# Patient Record
Sex: Male | Born: 1950 | Race: White | Hispanic: No | Marital: Married
Health system: Midwestern US, Community
[De-identification: ages and names within clinical notes are randomized; demographics above are authoritative.]

## PROBLEM LIST (undated history)

## (undated) DIAGNOSIS — R059 Cough, unspecified: Secondary | ICD-10-CM

## (undated) DIAGNOSIS — M7541 Impingement syndrome of right shoulder: Secondary | ICD-10-CM

## (undated) DIAGNOSIS — K5730 Diverticulosis of large intestine without perforation or abscess without bleeding: Secondary | ICD-10-CM

## (undated) DIAGNOSIS — I493 Ventricular premature depolarization: Secondary | ICD-10-CM

## (undated) DIAGNOSIS — R52 Pain, unspecified: Secondary | ICD-10-CM

## (undated) DIAGNOSIS — M4802 Spinal stenosis, cervical region: Secondary | ICD-10-CM

## (undated) DIAGNOSIS — S8991XD Unspecified injury of right lower leg, subsequent encounter: Secondary | ICD-10-CM

---

## 2014-08-29 LAB — DERMATOLOGY PATHOLOGY

## 2017-12-22 LAB — DERMATOLOGY PATHOLOGY

## 2018-09-29 LAB — DERMATOLOGY PATHOLOGY

## 2019-05-11 ENCOUNTER — Encounter

## 2019-05-11 ENCOUNTER — Inpatient Hospital Stay: Admit: 2019-05-11 | Payer: MEDICARE

## 2019-05-11 DIAGNOSIS — R52 Pain, unspecified: Secondary | ICD-10-CM

## 2019-12-07 ENCOUNTER — Encounter

## 2019-12-29 ENCOUNTER — Inpatient Hospital Stay: Admit: 2019-12-29 | Payer: MEDICARE

## 2019-12-29 ENCOUNTER — Ambulatory Visit: Payer: MEDICARE

## 2019-12-29 DIAGNOSIS — S8991XD Unspecified injury of right lower leg, subsequent encounter: Secondary | ICD-10-CM

## 2020-05-14 ENCOUNTER — Encounter

## 2020-05-14 ENCOUNTER — Inpatient Hospital Stay: Admit: 2020-05-14 | Payer: MEDICARE

## 2020-05-14 DIAGNOSIS — M4802 Spinal stenosis, cervical region: Secondary | ICD-10-CM

## 2020-06-03 ENCOUNTER — Inpatient Hospital Stay: Admit: 2020-06-03 | Payer: MEDICARE

## 2020-06-03 ENCOUNTER — Encounter

## 2020-06-03 DIAGNOSIS — M7541 Impingement syndrome of right shoulder: Secondary | ICD-10-CM

## 2021-03-12 ENCOUNTER — Inpatient Hospital Stay: Admit: 2021-03-12 | Payer: MEDICARE

## 2021-03-12 ENCOUNTER — Encounter

## 2021-03-12 DIAGNOSIS — R059 Cough, unspecified: Secondary | ICD-10-CM

## 2021-09-25 LAB — PSA, DIAGNOSTIC: PSA: 0.98 ng/mL (ref <4.1)

## 2022-06-05 ENCOUNTER — Encounter: Admit: 2022-06-05 | Discharge: 2022-06-05 | Payer: MEDICARE

## 2022-06-05 DIAGNOSIS — I493 Ventricular premature depolarization: Secondary | ICD-10-CM

## 2022-06-05 NOTE — Progress Notes (Deleted)
Holter monitor applied    Nereyda Bowler, MA

## 2022-06-09 ENCOUNTER — Encounter

## 2023-02-17 IMAGING — MR MRI LUMBAR SPINE W/WO CONTRAST
8 of 12 series · 17 of 48 positions shown · IV contrast (gadavist)
Comparison: No prior lumbar examination

________________________________________________________________________________________________ 
MRI LUMBAR SPINE W/WO CONTRAST, 02/17/2023 [DATE]: 
CLINICAL INDICATION: Intervertebral disc disorder with radiculopathy, low back 
pain, disc surgery July 26, 2022
TECHNIQUE: Sagittal T1, Sagittal T2, Sagittal STIR, Axial T2, Axial T1, Enhanced 
sagittal T1, Enhanced fat suppressed sagittal T1, and Enhanced axial T1 MR 
images of the lumbar spine were performed with and without intravenous 
enhancement.  9.5 mL of Gadavist were injected intravenously. 0.5 mL of Gadavist 
was discarded.  Patient was scanned on a 1.5T magnet.

[Series 101: survey · axial · 10.0mm · 1.25mm/px · z∈[+109,+351]mm · 2 of 10 slices shown]
[im 1/10]
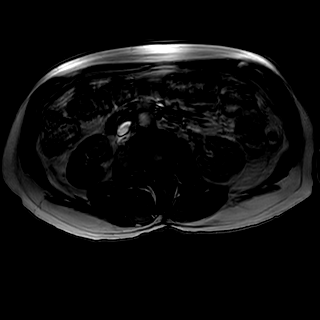
[im 10/10]
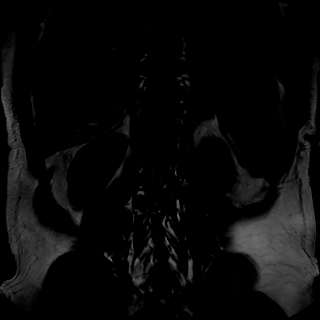

[Series 201: t2w_cor-surv · coronal · 6.0mm · 0.62mm/px · 1 of 14 slices shown]
[im 1/14]
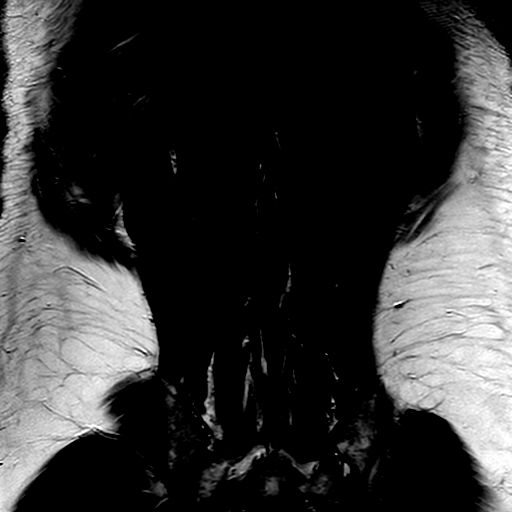

[Series 301: T1 · sagittal · 4.0mm · 0.41mm/px · 2 of 19 slices shown (1 of 2)]
[im 1/19]
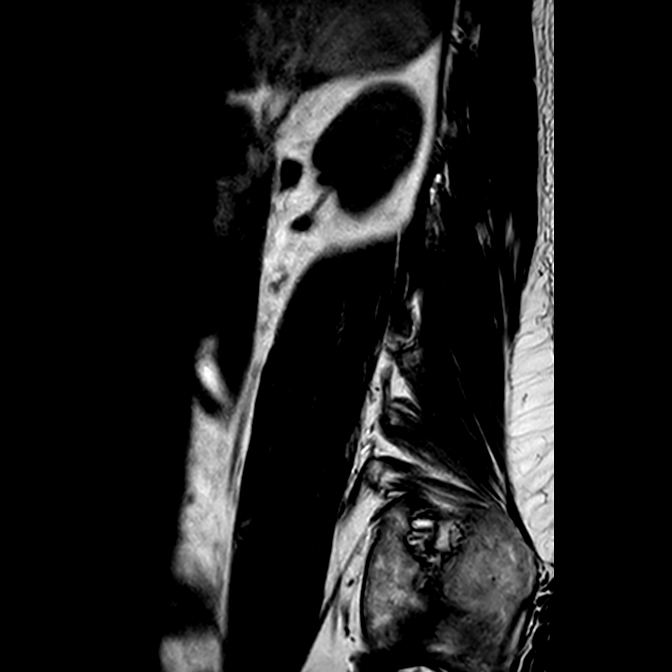
[im 19/19]
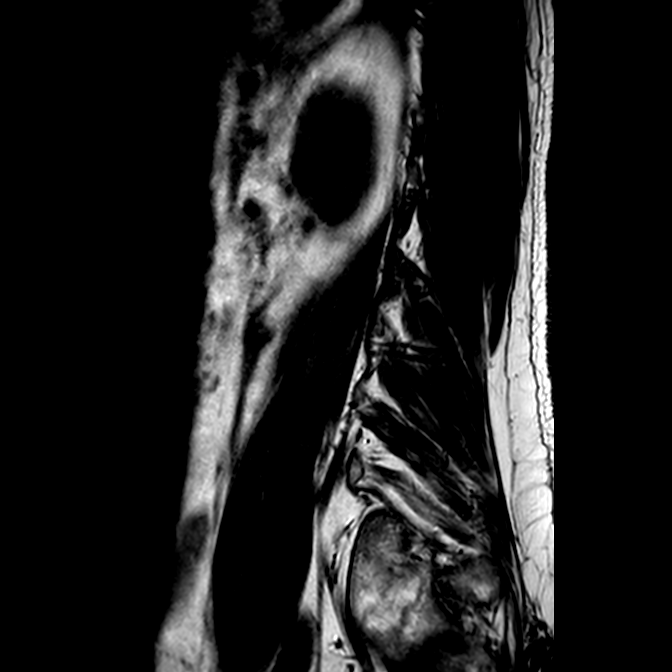

[Series 402: (id)_mdixon_tse · sagittal · 4.0mm · 0.50mm/px · 2 of 19 slices shown]
[im 1/19]
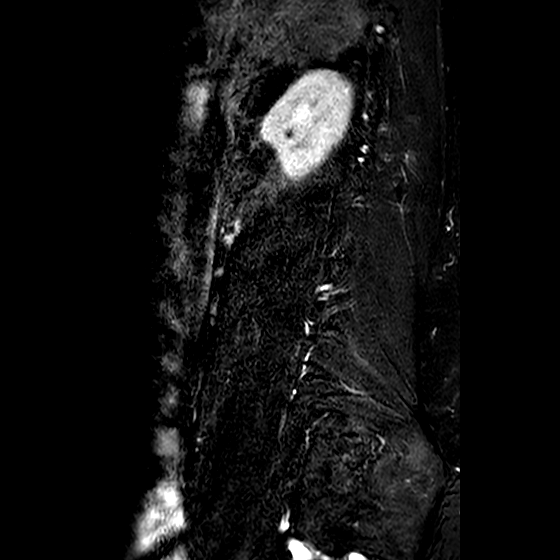
[im 19/19]
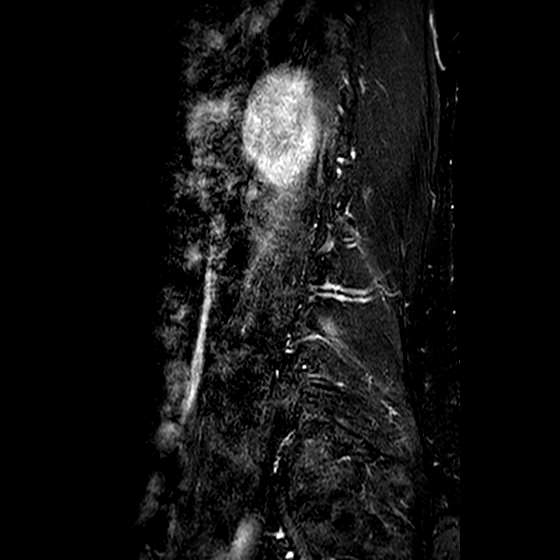

[Series 403: st2w_mdixon_tse · sagittal · 4.0mm · 0.50mm/px · 1 of 19 slices shown]
[im 1/19]
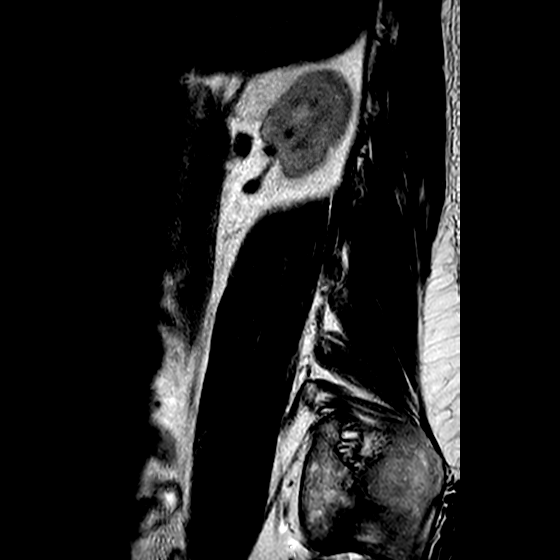

[Series 601: T2 · oblique · 4.0mm · 0.30mm/px · 3 of 30 slices shown]
[im 1/30]
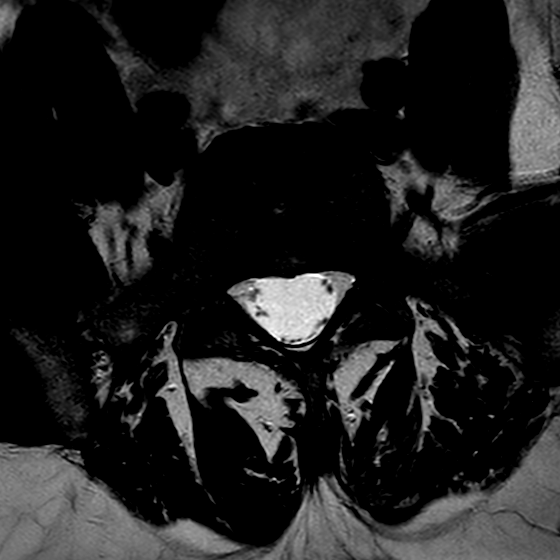
[im 15/30]
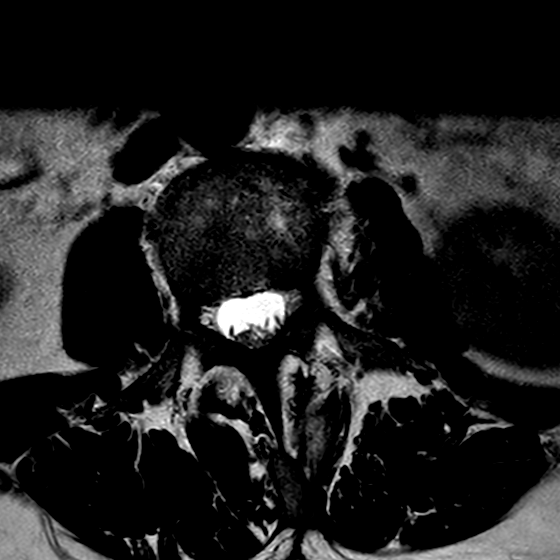
[im 30/30]
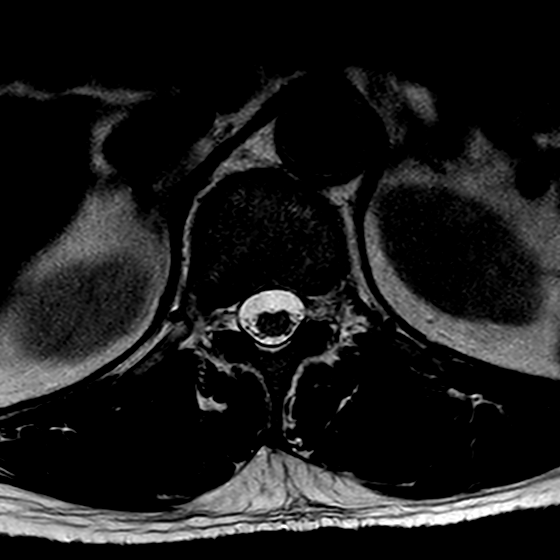

[Series 701: T1 · oblique · 4.0mm · 0.33mm/px · 3 of 30 slices shown (2 of 2)]
[im 1/30]
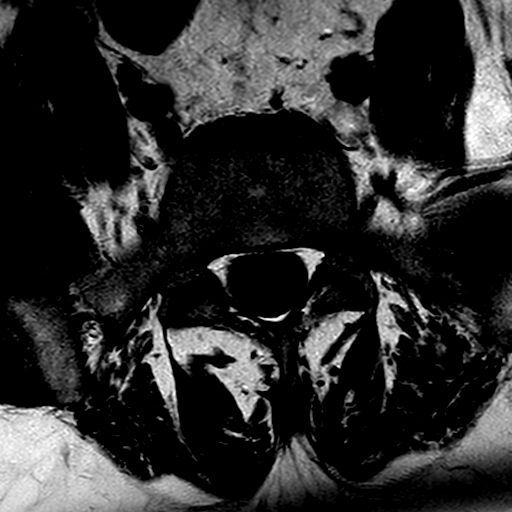
[im 15/30]
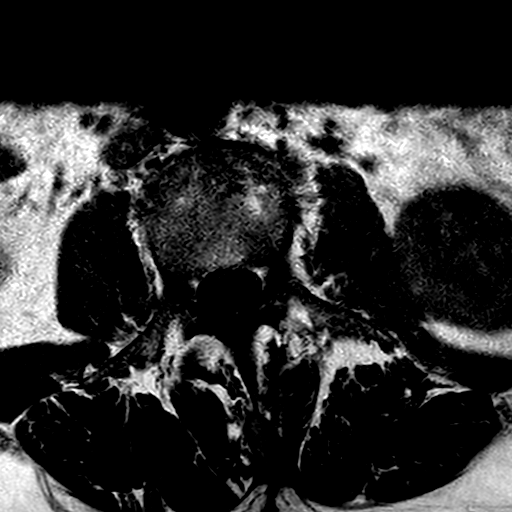
[im 30/30]
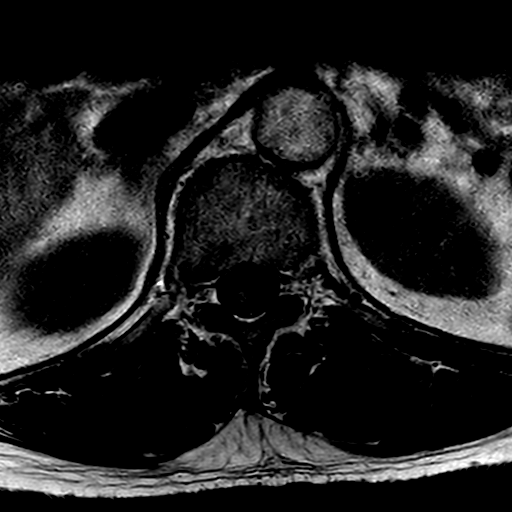

[Series 901: T1 post-contrast · oblique · 4.0mm · 0.33mm/px · 3 of 30 slices shown]
[im 1/30]
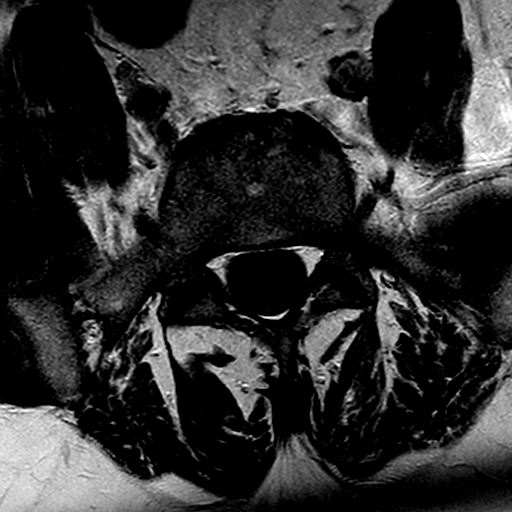
[im 15/30]
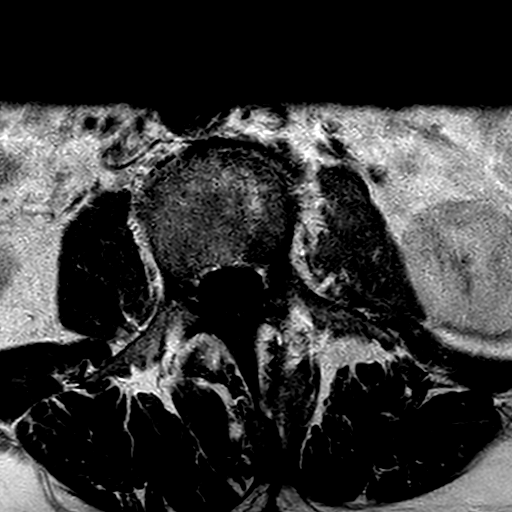
[im 30/30]
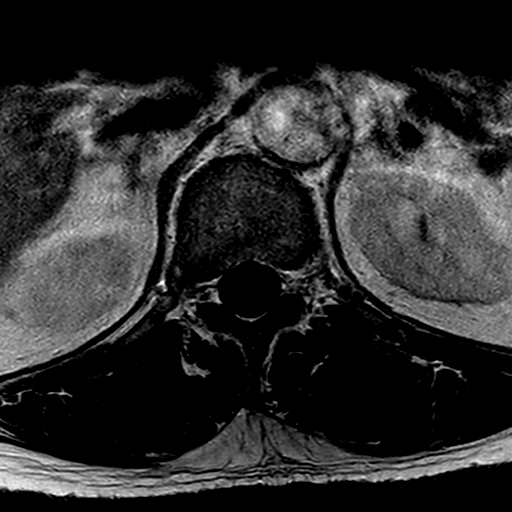

[17 of 48 positions shown; findings below may reference images not displayed]

FINDINGS: At L2-3 there is marked narrowing along the left interspace. There has 
been left laminectomy. There is less than grade 1 retrolisthesis measuring 3 
mm.. There is extensive reactive endplate edema and enhancement. There is 
extension of nonenhancing tissue along the posterosuperior disc space to the 
left of midline, which deforms the left ventral thecal sac, contributing to 
moderate left-sided canal stenosis and impinges the left L3 nerve root, best 
seen on sagittal image 9, axial T2 image 20. On axial images this tissue extends 
13 x 12 mm. The nonenhancing component extends in oblique fashion 2.3 cm 
measured on the fat suppressed enhanced image 9, isointense on T2 weighted 
images, mildly hyperintense on STIR sequences, hypointense on T1-weighted 
images. On sagittal views this soft tissue appears to dissect across the left 
posterior inferior L2 vertebral body. There is no significant enhancement or 
increased T2 signal within the disc space. There is a thin rind of enhancement 
surrounding this soft tissue. There is encroachment at the entrance to the left 
foramen. Right foramen is open. 
There are degenerative changes elsewhere. At L5-S1 the canal and foramina are 
open. 
At L4-5 the canal is open. Mild bilateral foraminal stenosis and moderate facet 
change. 
At L3-4 the canal is open. Foramina are open. Mild to moderate facet change. 
At L1-2 the canal and foramina are 
Conus terminates opposite L1. There is mild upper lumbar dextrocurvature. There 
is no retroperitoneal adenopathy.
IMPRESSION: At L2-3 there has been left laminectomy. There is soft tissue extending from the 
posterior disc space, appearing to dissect across the posterior inferior L2 
vertebral body into the left ventral epidural space opposite L2 where there is 
marked deformity on the thecal sac and impingement of the left L3 nerve root. 
This tissue does not show internal enhancement. There is a surrounding rind of 
enhancement, and there is reactive edema and enhancement of the L2-3 endplates. 
This likely represents a large disc extrusion, but abscess cannot be excluded. 
The L2-3 disc space does not show enhancement.

## 2023-02-17 IMAGING — DX LUMBAR SPINE AP, LAT WITH FLEXION AND EXTEN
1 series · 4 of 4 positions shown · non-contrast
Comparison: None prior.

________________________________________________________________________________________________ 
LUMBAR SPINE AP, LAT WITH FLEXION AND EXTEN, 02/17/2023 [DATE]: 
CLINICAL INDICATION: Intervertebral disc disorders w radiculopathy, lumbar 
region.

[Series 1: AP · 0.14mm/px · 4 of 4 slices shown]
[im 1/4]
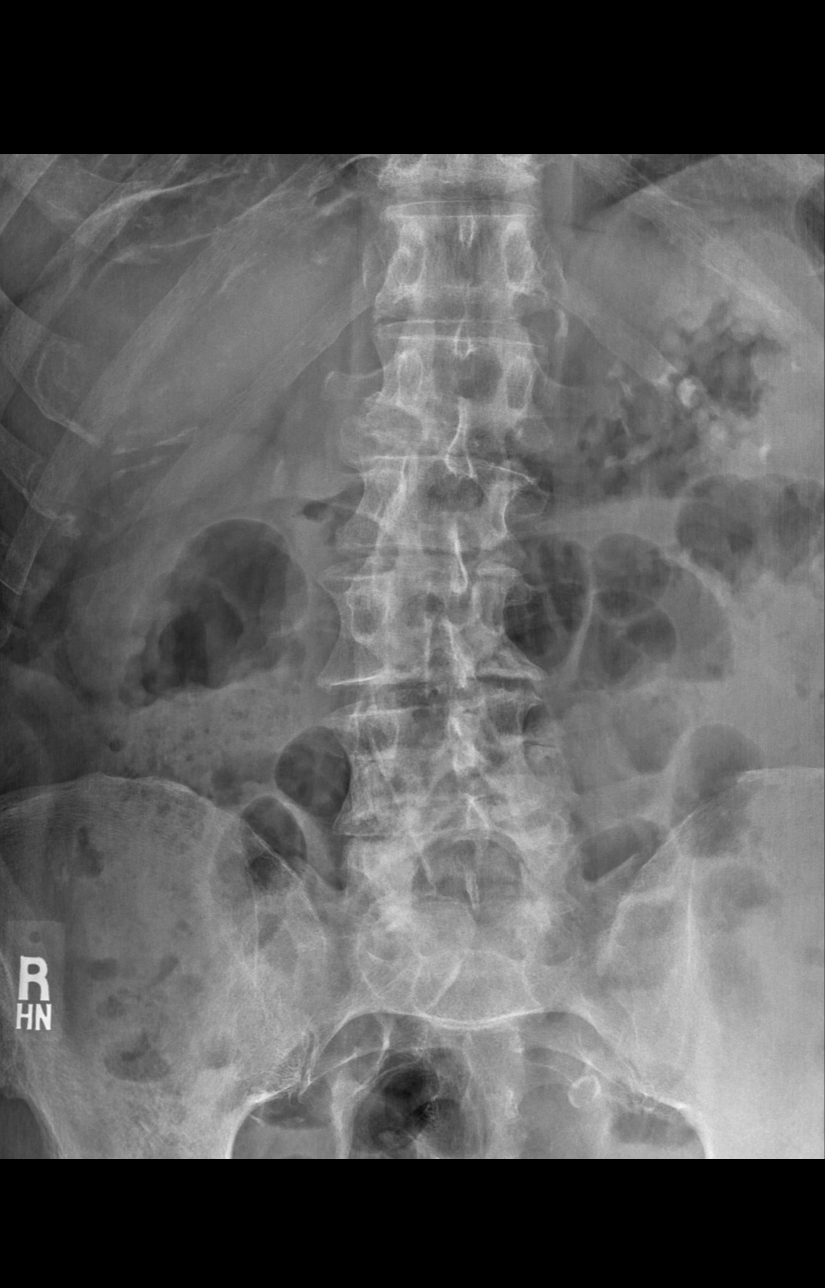
[im 2/4]
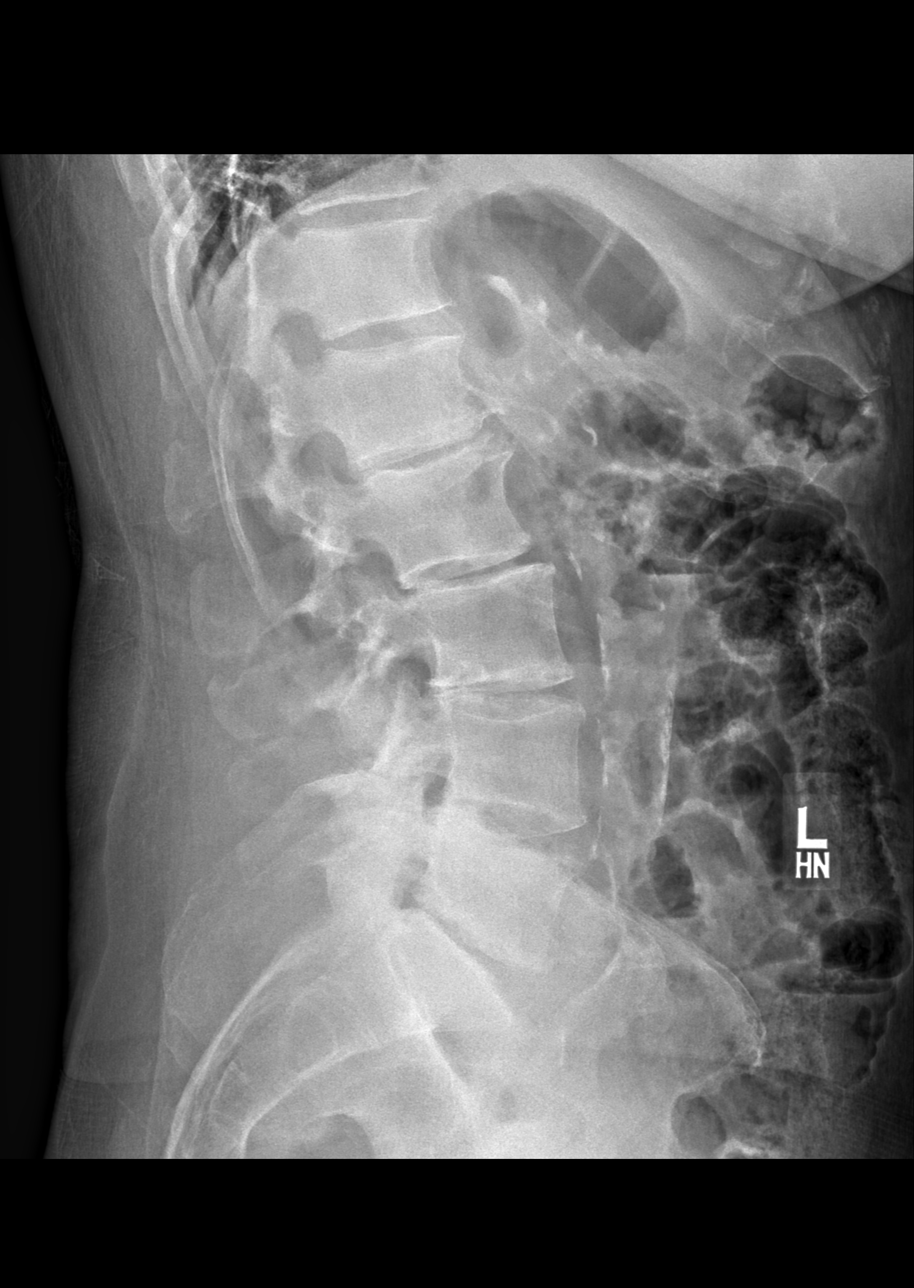
[im 3/4]
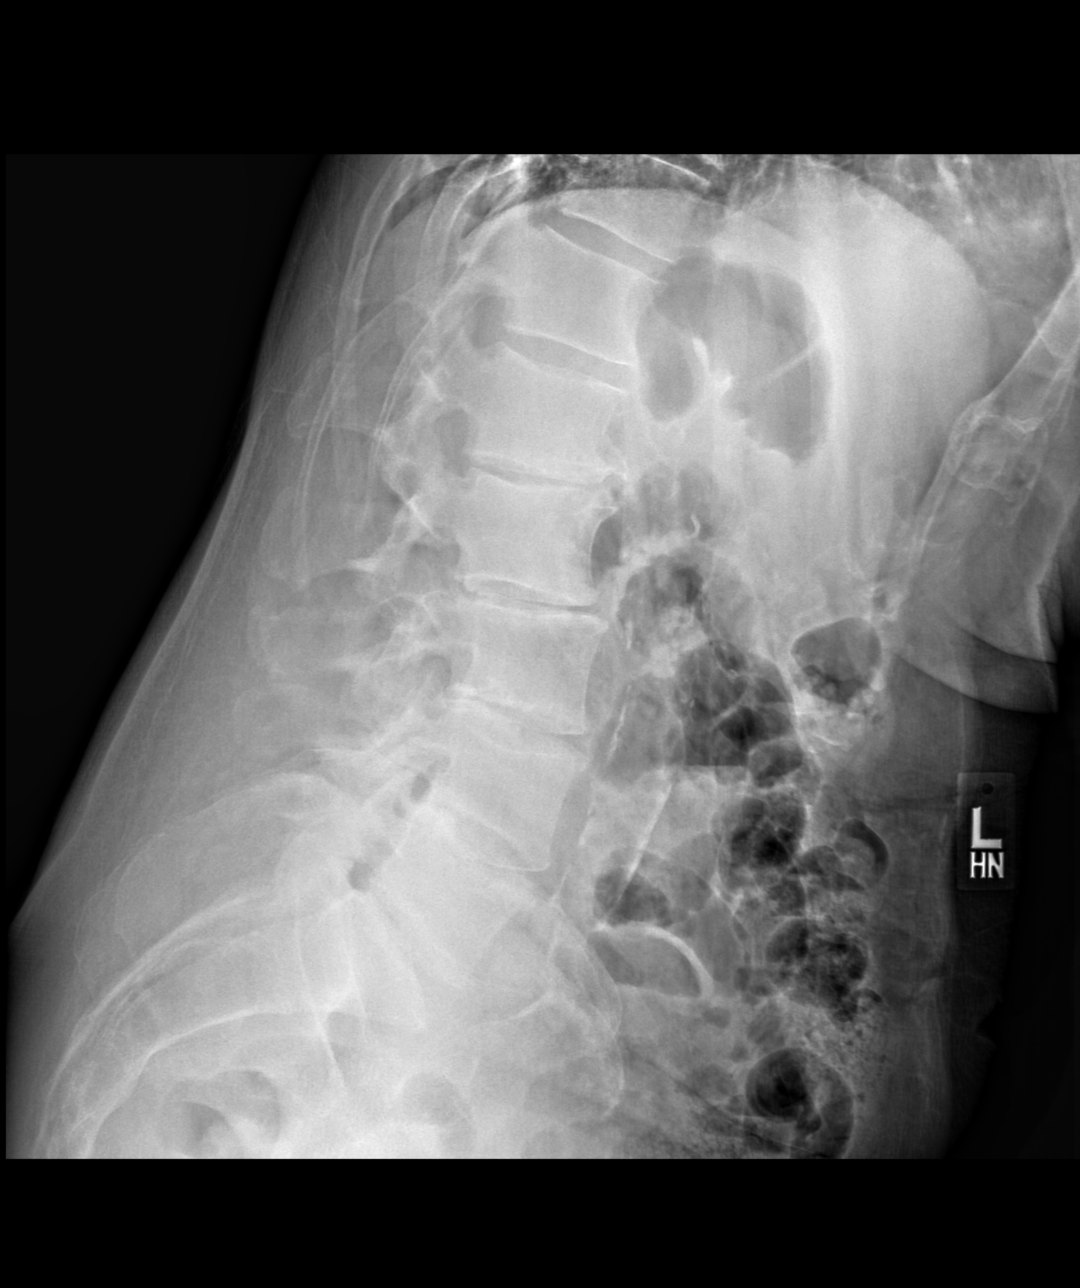
[im 4/4]
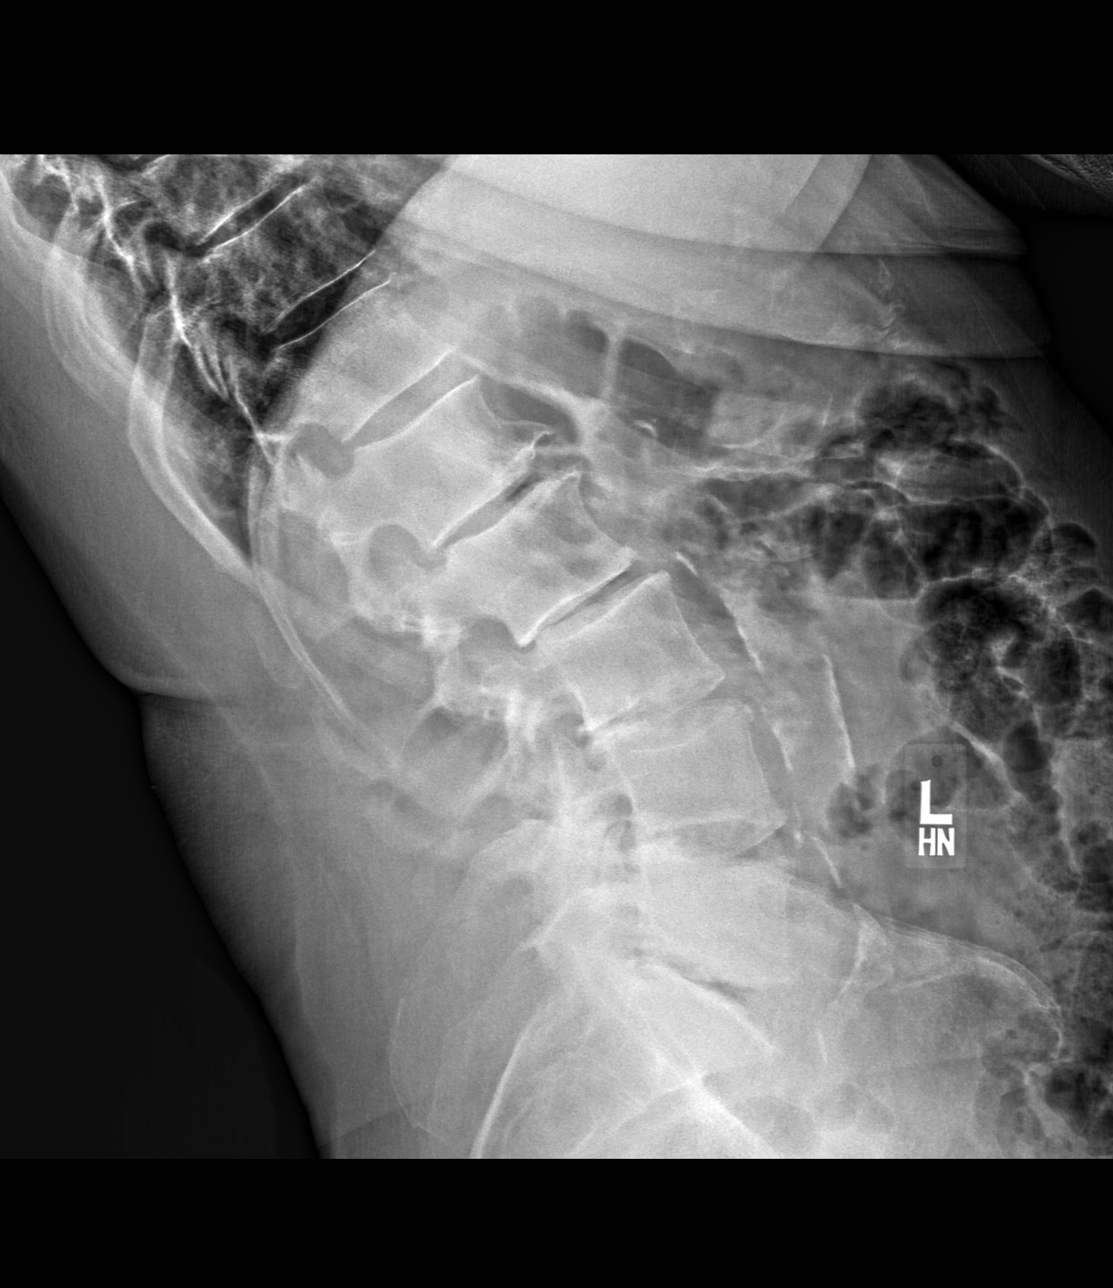

[4 of 4 positions shown; findings below may reference images not displayed]

FINDINGS: Mild dextrocurvature. Mild retrolisthesis L2 on L3 without change with 
flexion or extension positioning. Scattered disc height loss and osteophytic 
spurring. There is prominent lower lumbar facet hypertrophy. No vertebral body 
fracture. Scattered vascular calcifications.
IMPRESSION: Dextrocurvature and moderately advanced spondylotic changes lumbar spine.

## 2023-05-07 IMAGING — DX LUMBAR SPINE AP, LAT WITH FLEXION AND EXTEN
1 series · 4 of 4 positions shown · non-contrast
Comparison: 02/19/2023

________________________________________________________________________________________________ 
LUMBAR SPINE AP, LAT WITH FLEXION AND EXTEN, 05/07/2023 [DATE]: 
CLINICAL INDICATION: Intervertebral disc disorders w radiculopathy, lumbar 
region

[Series 1: AP · 0.14mm/px · 4 of 4 slices shown]
[im 1/4]
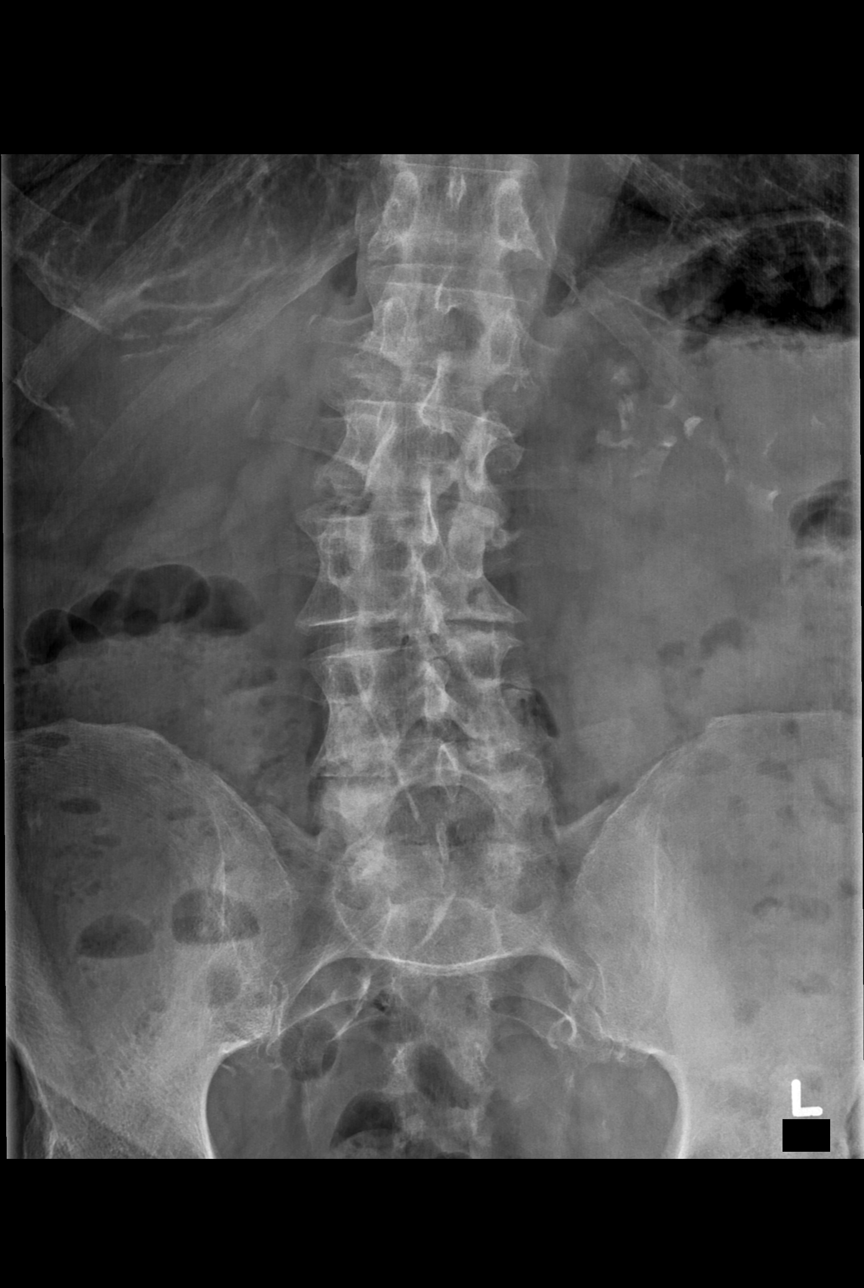
[im 2/4]
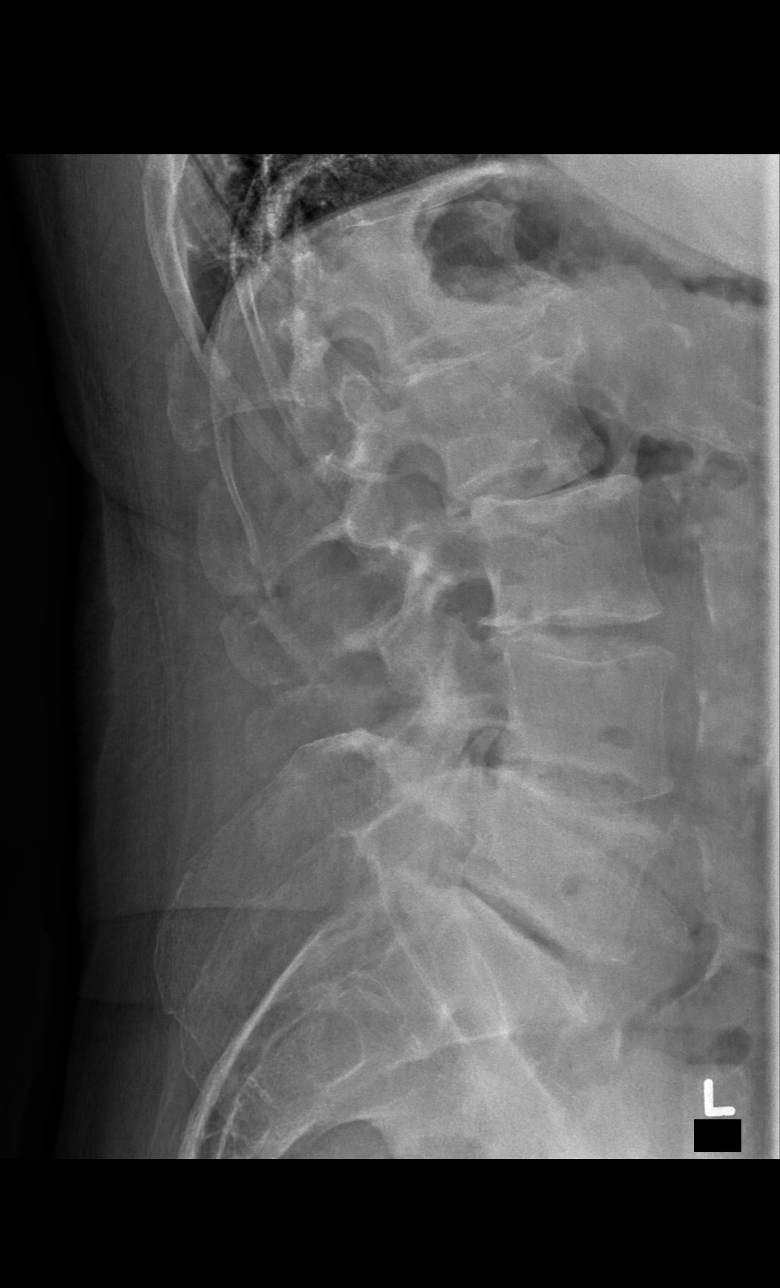
[im 3/4]
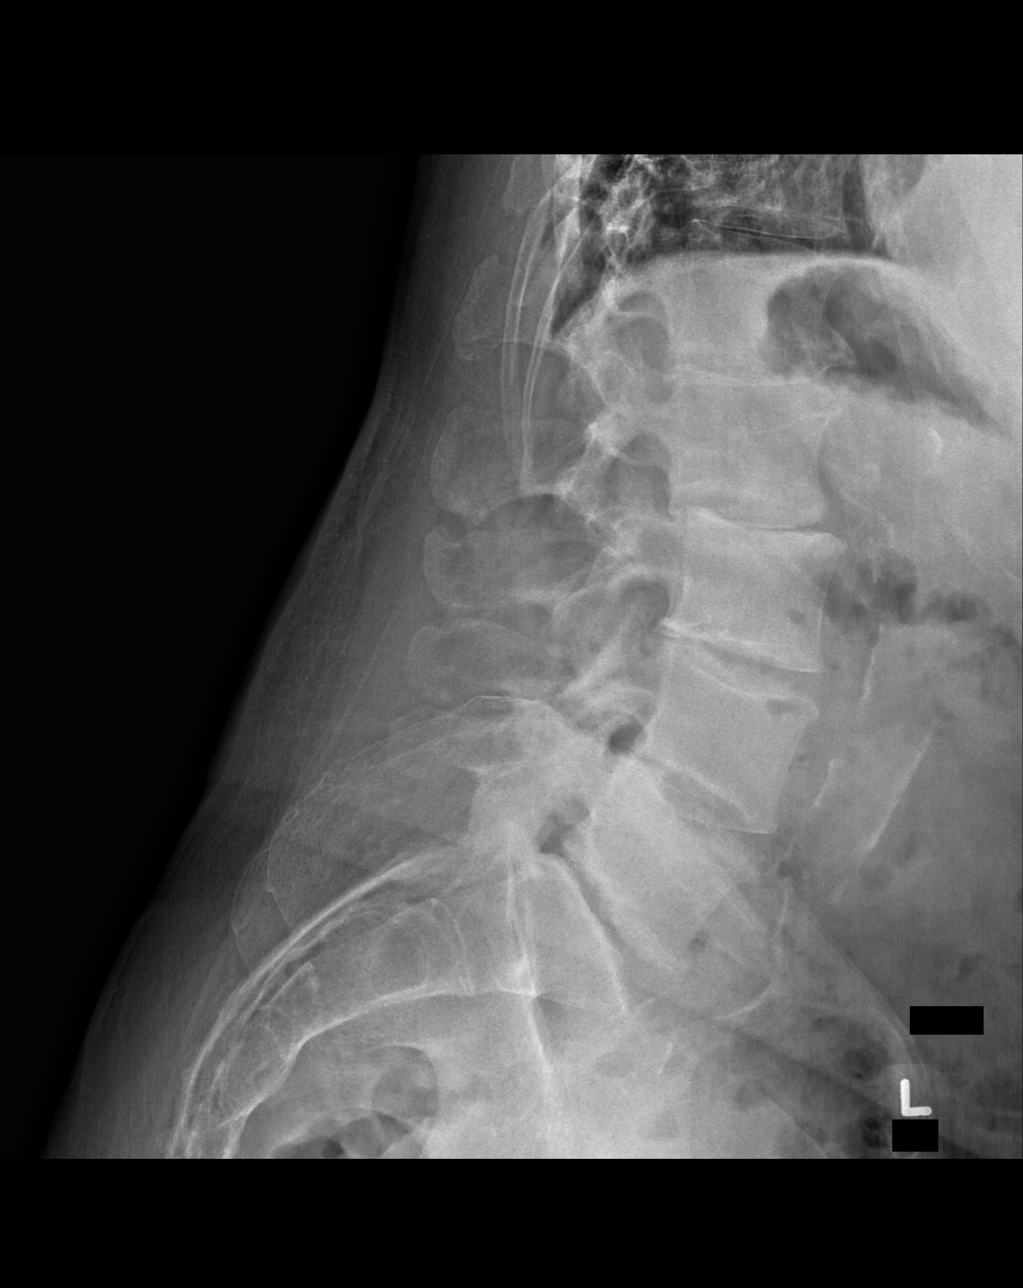
[im 4/4]
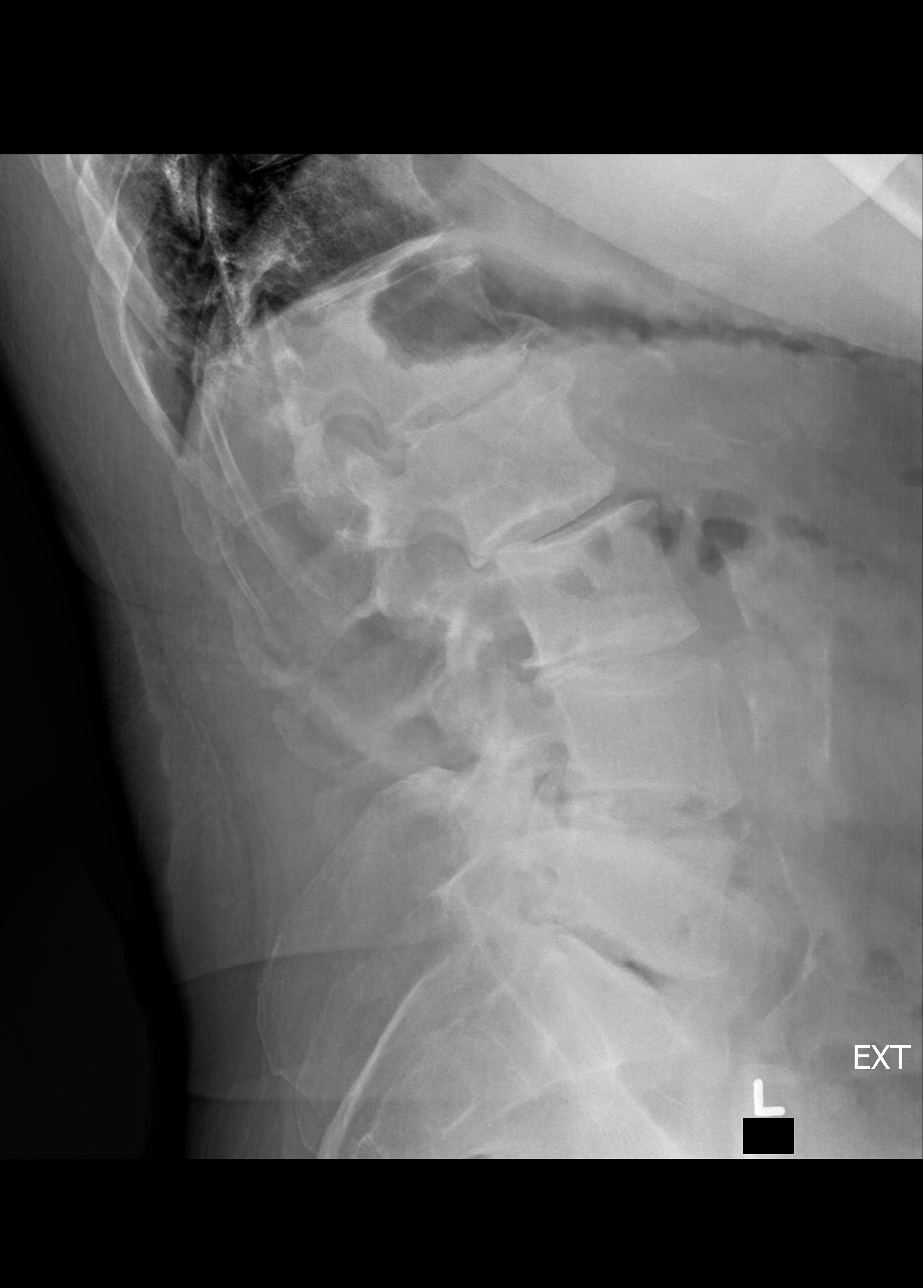

[4 of 4 positions shown; findings below may reference images not displayed]

FINDINGS: No fracture or dynamic instability. Mild multifocal disc space 
narrowing and marginal osteophytes. Stable mild retrolisthesis at L2-3. 15 
degrees rotatory dextroscoliosis. Lower lumbar/lumbosacral facet arthropathy. 
Degenerative change of the SI joints. Atherosclerosis.
IMPRESSION: 1.  Stable multifocal degenerative change, mild retrolisthesis at L2-3 and mild 
rotatory dextroscoliosis.  
2.  No dynamic instability.

## 2023-05-07 IMAGING — MR MRI LUMBAR SPINE W/WO CONTRAST
7 of 12 series · 13 of 48 positions shown · IV contrast (gadavist)
Comparison: Radiographs of the same day and exams of 02/17/2023.

________________________________________________________________________________________________ 
MRI LUMBAR SPINE W/WO CONTRAST, 05/07/2023 [DATE]: 
CLINICAL INDICATION: Intervertebral disc disorders w radiculopathy, lumbar 
region
TECHNIQUE: Multiplanar, multiecho position MR images of the lumbar spine were 
performed without and with 9.5 mL of Gadavist were injected intravenously by 
hand. 0.5 mL of Gadavist  discarded. Patient was scanned on a 1.5T magnet

[Series 101: survey · axial · 10.0mm · 1.25mm/px · 1 of 10 slices shown]
[im 1/10]
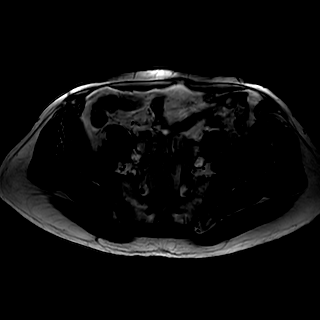

[Series 201: t2w_cor-surv · coronal · 6.0mm · 0.62mm/px · 2 of 14 slices shown]
[im 1/14]
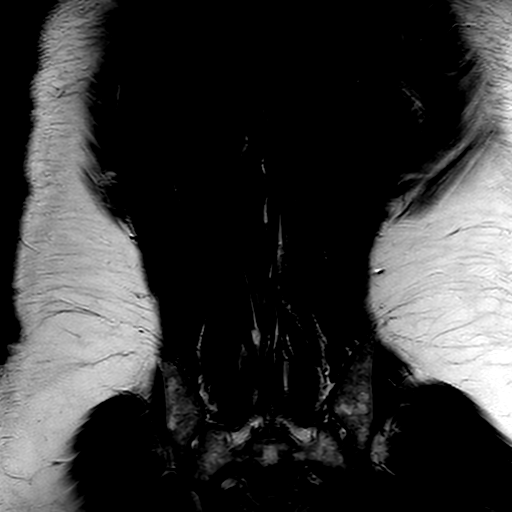
[im 14/14]
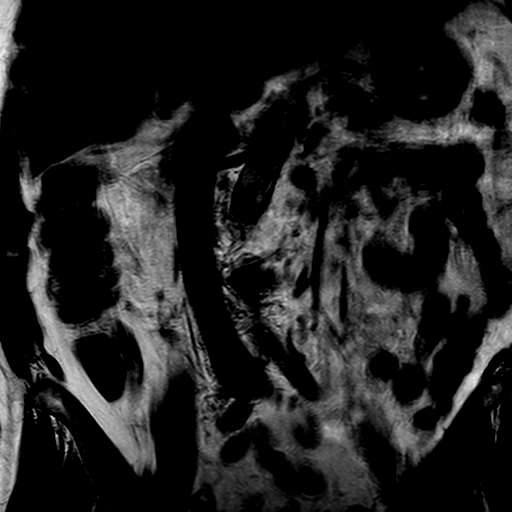

[Series 301: t1_tse_sag · sagittal · 4.0mm · 0.41mm/px · 2 of 19 slices shown]
[im 1/19]
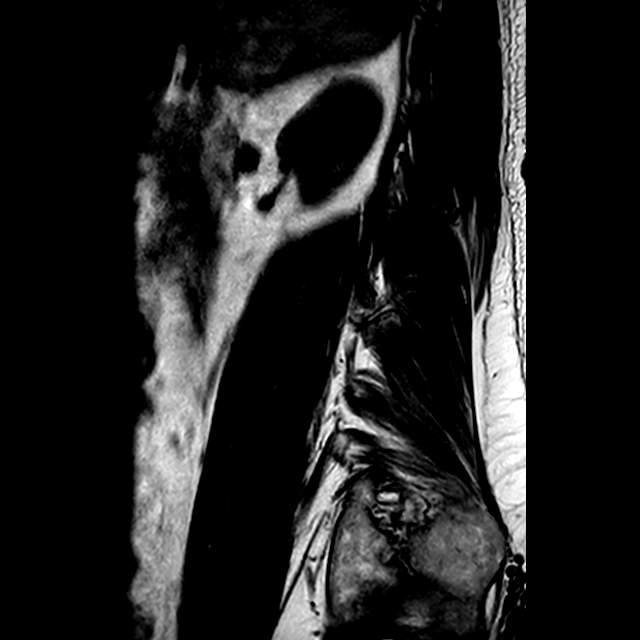
[im 19/19]
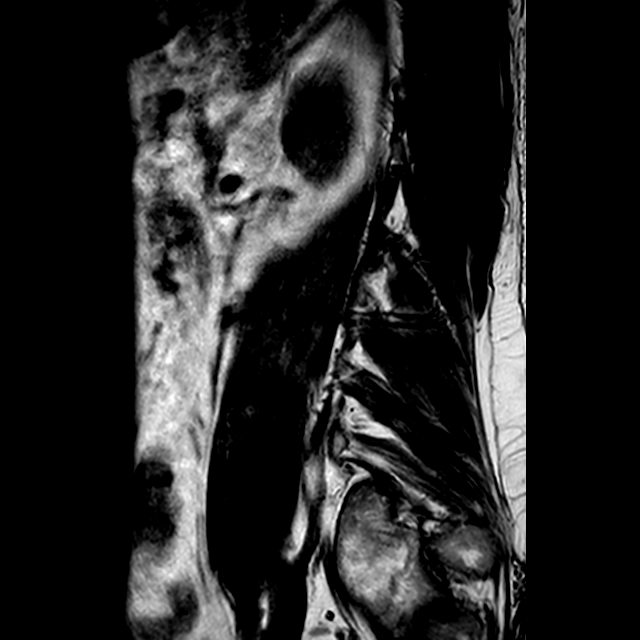

[Series 402: (id)_mdixon_tse · sagittal · 4.0mm · 0.46mm/px · 2 of 19 slices shown]
[im 1/19]
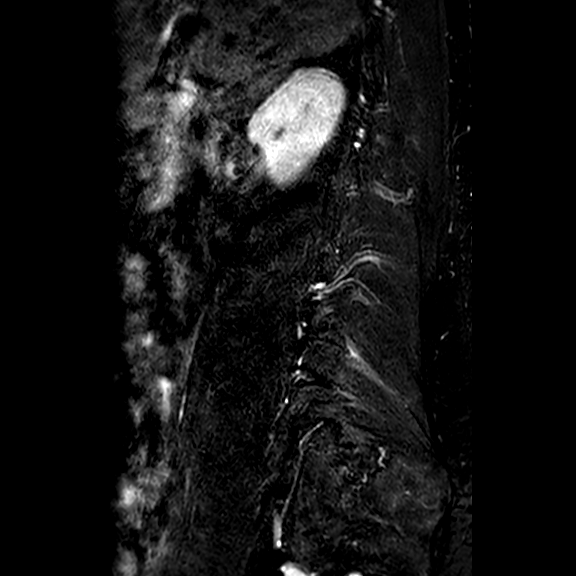
[im 19/19]
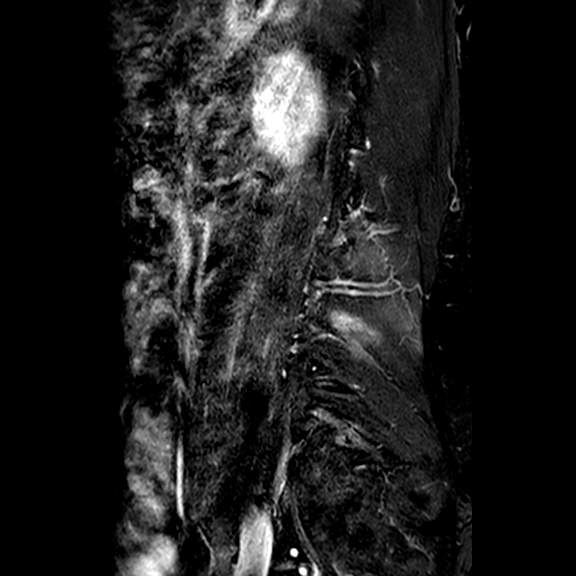

[Series 403: st2w_mdixon_tse · sagittal · 4.0mm · 0.46mm/px · 2 of 19 slices shown]
[im 1/19]
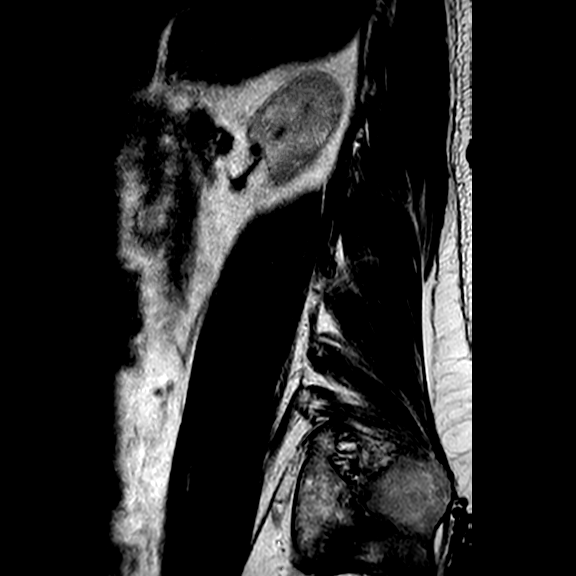
[im 19/19]
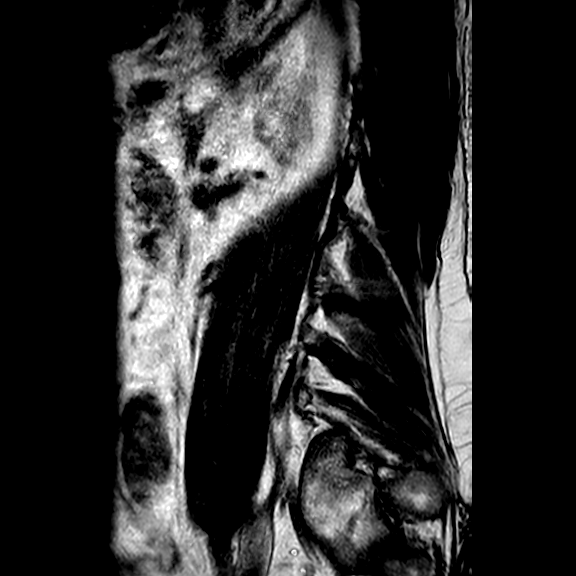

[Series 502: (id) view_ax mpr · axial · 1.0mm · 0.25mm/px · 1 of 119 slices shown]
[im 1/119]
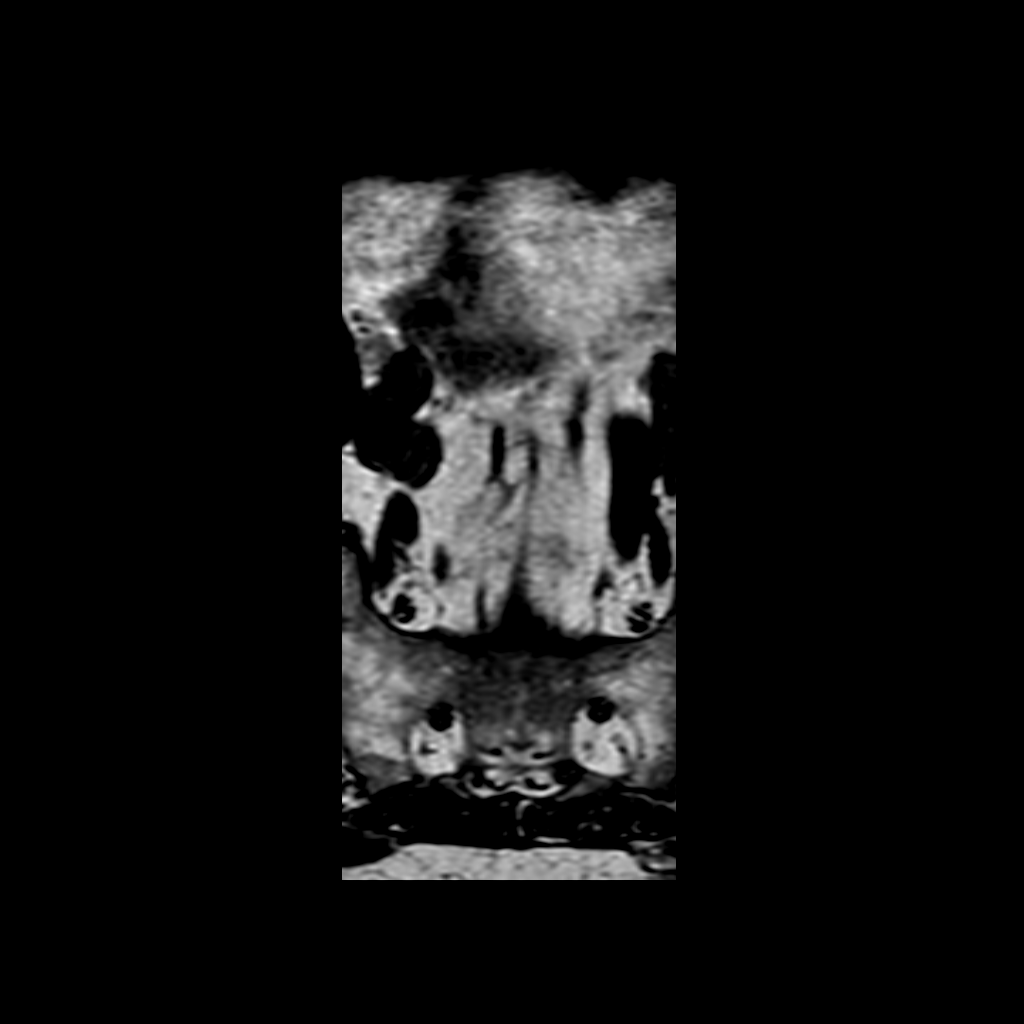

[Series 901: T1 post-contrast · oblique · 4.0mm · 0.33mm/px · 3 of 30 slices shown]
[im 1/30]
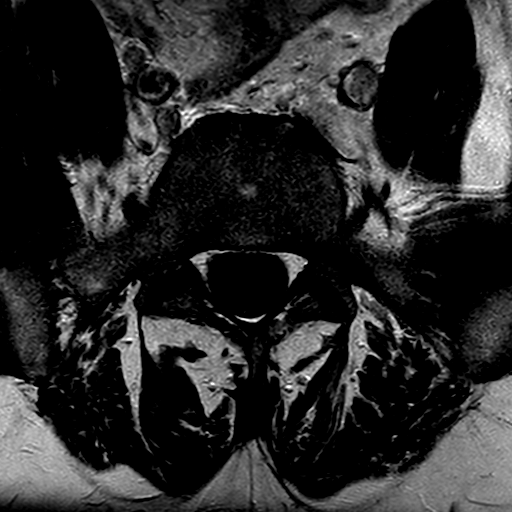
[im 15/30]
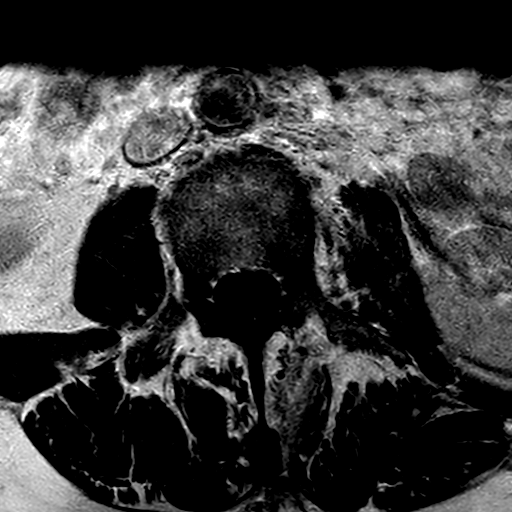
[im 30/30]
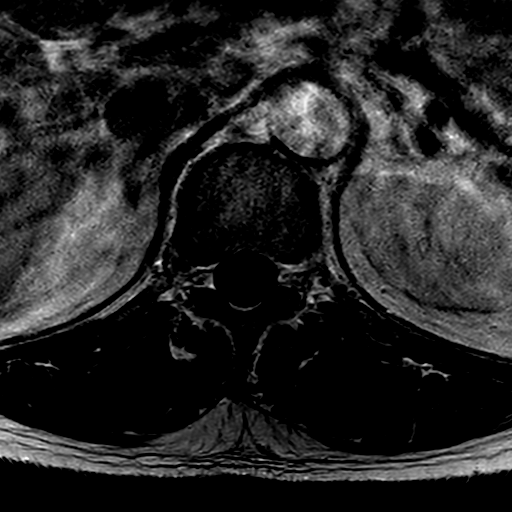

[13 of 48 positions shown; findings below may reference images not displayed]

FINDINGS: -------------------------------------------------------------------------------- 
------ 
GENERAL: 
There are 5 lumbar-type vertebral bodies. Grade 1 retrolisthesis L2 on L3. No 
vertebral body fracture. Mild dextrocurvature with apex at L2-L3. Conus tip 
terminates at the L1-L2 level. There are postsurgical changes at L2-L3 with 
small amount of fluid along the surgical tract, marrow edema paralleling the 
endplates again seen. Normal aortic diameter. 
 Modic I-II: All levels 
Ligamentum Flavum > 2.5 mm: All levels. 
-------------------------------------------------------------------------------- 
------ 
SEGMENTAL: 
T12-L1: Normal disc height and signal. No herniation. Normal facets. No spinal 
canal or neural foraminal stenosis. 
L1-L2: Moderate disc desiccation disc height loss. No disc herniation. Normal 
facets. No spinal canal or neural foraminal stenosis. 
L2-L3: There postsurgical changes of right hemilaminectomy and partial 
facetectomy. There is a small amount of fluid within the surgical tract without 
contrast indication to our mass effect upon the thecal sac delineated. Mild 
facet hypertrophy. Interval partial discectomy with enhancing granulation tissue 
at the orifice of the left neural foramen. Enhancing granulation tissue 
surrounds the exiting left L2 nerve root. No discrete residual/recurrent disc. 
There is a residual Schmorls node involving the dorsal inferior aspect of the 
L2 vertebral body. 
L3-L4: Diffuse disc desiccation and disc height loss. Mild dorsal disc 
osteophyte ridging. Mild facet hypertrophy. Mild left greater than right neural 
foraminal stenoses. 
L4-L5: There is disc desiccation and disc height loss. No disc herniation. Mild 
facet hypertrophy. Moderate bilateral neural foraminal stenoses. 
L5-S1: Diffuse disc desiccation disc height loss. No disc herniation. Facet 
hypertrophy. No spinal canal or neural foraminal stenosis. 
-------------------------------------------------------------------------------- 
------
IMPRESSION: 1.  Interval postsurgical changes at L2-L3 without residual/recurrent disc. 
2.  Multilevel neural foraminal stenoses including L3-L4 and L4-L5. 
3.  No spinal canal stenosis.

## 2023-07-01 NOTE — Telephone Encounter (Signed)
 Will call back to schedule consult next week

## 2023-09-28 NOTE — H&P (Addendum)
 PROCEDURE EVALUATION & HISTORY AND PHYSICAL EXAM    SUBJECTIVE:    Mason Blackwell is a 72 year old man who presents to [facility] for spinal cord stimulator trial, [employer]. This is his first (1) procedure..    Focused

## 2023-10-06 NOTE — Progress Notes (Signed)
 SUBJECTIVE  Mason Blackwell had a SCS  trial.  The trial began on 09/29/2023 and ended on 10/06/2023.     Prior to the trial, he reported pain levels ranging on average 4-5/10.    Sleep was better during the week,   Felt left leg pain was worse with progr

## 2023-12-01 NOTE — Progress Notes (Signed)
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 Current pain intensity is 4 on a scale of 0 -10. Pain is located in back at the incision site. The pain is described as dull pain. He has finished the course of antibiotics without difficulty. He denies fever, chills, or any signs of infection.    Plan at last visit: 11/23/2023  1) s/p spinal cord stimulator implant.  2) RTC in 1 week for suture removal.  3) The treatment plan was discussed with Mason Blackwell.  Post procedure instructions were reviewed and he voiced understanding.    OBJECTIVE:   The lower thoracic and buttocks dressings were noted to be clean, dry and intact. The dressings were removed and both suture lines were well approximated and dry. No warmth, erythema or swelling noted at the site. The incision line was cleansed with Chloraprep. The sutures were removed using sterile technique. The incision lines were cleansed with Chloraprep and a primapore dressing applied. The [employer] representative was here and evaluated his programming.     ASSESSMENT: Mason Blackwell is stable from a pain management standpoint. His incisions are well approximated and without drainage., erythema, warmth or signs of infection. He will follow up in 4 weeks virtually as he will be down in [place]. Mason Blackwell from [place] to provide patient with [place] representative down in [place]. Patient verbalizes understanding.     PLAN:   -3 closed loop programs   -Patient instructions given regarding care of incisions, signs/symptoms of infection, shower instructions and activity restrictions.   - Patient to follow-up with [employer] representative as needed or if programming changes are needed.   -Patient instructed to call or return if pain increases, becomes worse or changes.   -RTC in 4  weeks virtual visit follow up     The plan of care was continued as established by Dr. Patric Dykes. The above plan and management options were discussed at length with patient. Patient is in agreement with the above and verbalized understanding.    Mason Rome, APRN.CNP  December 01, 2023  10:56 AM

## 2023-12-02 ENCOUNTER — Inpatient Hospital Stay: Payer: MEDICARE

## 2023-12-02 MED ORDER — PROPOFOL 200 MG/20ML IV EMUL
20020 | Freq: Once | INTRAVENOUS | Status: DC | PRN
Start: 2023-12-02 — End: 2023-12-02
  Administered 2023-12-02: 20:00:00 100 via INTRAVENOUS
  Administered 2023-12-02: 20:00:00 90 via INTRAVENOUS

## 2023-12-02 MED ORDER — LACTATED RINGERS IV SOLN
INTRAVENOUS | Status: DC
Start: 2023-12-02 — End: 2023-12-02
  Administered 2023-12-02: 19:00:00 via INTRAVENOUS

## 2023-12-02 MED ORDER — PROPOFOL 200 MG/20ML IV EMUL
20020 | INTRAVENOUS | Status: AC
Start: 2023-12-02 — End: ?

## 2023-12-02 MED ORDER — LIDOCAINE HCL (PF) 2 % IJ SOLN
2 | Freq: Once | INTRAMUSCULAR | Status: DC | PRN
Start: 2023-12-02 — End: 2023-12-02
  Administered 2023-12-02: 20:00:00 100 via INTRAVENOUS

## 2023-12-02 MED ORDER — NORMAL SALINE FLUSH 0.9 % IV SOLN
0.9 | Freq: Two times a day (BID) | INTRAVENOUS | Status: DC
Start: 2023-12-02 — End: 2023-12-02

## 2023-12-02 MED ORDER — NORMAL SALINE FLUSH 0.9 % IV SOLN
0.9 | INTRAVENOUS | Status: DC | PRN
Start: 2023-12-02 — End: 2023-12-02

## 2023-12-02 MED ORDER — LACTATED RINGERS IV SOLN
INTRAVENOUS | Status: DC | PRN
Start: 2023-12-02 — End: 2023-12-02
  Administered 2023-12-02: 19:00:00 via INTRAVENOUS

## 2023-12-02 MED ORDER — SODIUM CHLORIDE 0.9 % IV SOLN
0.9 | INTRAVENOUS | Status: DC
Start: 2023-12-02 — End: 2023-12-02

## 2023-12-02 MED ORDER — SODIUM CHLORIDE 0.9 % IV SOLN
0.9 | INTRAVENOUS | Status: DC | PRN
Start: 2023-12-02 — End: 2023-12-02

## 2023-12-02 MED ORDER — PROPOFOL 200 MG/20ML IV EMUL
20020 | Freq: Once | INTRAVENOUS | Status: DC | PRN
Start: 2023-12-02 — End: 2023-12-02

## 2023-12-02 MED ORDER — LIDOCAINE HCL (PF) 1 % IJ SOLN
1 | Freq: Once | INTRAMUSCULAR | Status: DC | PRN
Start: 2023-12-02 — End: 2023-12-02

## 2023-12-02 MED ORDER — LIDOCAINE HCL (PF) 2 % IJ SOLN
2 | INTRAMUSCULAR | Status: AC
Start: 2023-12-02 — End: ?

## 2023-12-02 MED FILL — DIPRIVAN 200 MG/20ML IV EMUL: 20020 MG/20ML | INTRAVENOUS | Qty: 20

## 2023-12-02 MED FILL — XYLOCAINE-MPF 2 % IJ SOLN: 2 % | INTRAMUSCULAR | Qty: 5

## 2023-12-02 NOTE — Anesthesia Pre-Procedure Evaluation (Signed)
Department of Anesthesiology  Preprocedure Note       Name:  Mason Blackwell   Age:  73 y.o.  DOB:  September 09, 1951                                          MRN:  [id number]         Date:  12/02/2023      Surgeon: Moishe Spice):  August Saucer, MD    Procedure: Procedure(s):  COLONOSCOPY DIAGNOSTIC    Medications prior to admission:   Prior to Admission medications    Medication Sig Start Date End Date Taking? Authorizing Provider   B Complex-Folic Acid (B COMPLEX-VITAMIN B12 PO) Take 1 tablet by mouth daily   Yes [provider]   metoprolol succinate (TOPROL XL) 50 MG extended release tablet Take 1 tablet by mouth daily 11/24/23  Yes [provider]   triamcinolone (KENALOG) 0.1 % cream Apply 0.1 g topically As needed for eczema on hands.   Yes [provider]   traMADol (ULTRAM) 50 MG tablet Take 1 tablet by mouth daily as needed.   Yes [provider]   simvastatin (ZOCOR) 20 MG tablet Take 1 tablet by mouth three times a week 11/24/23  Yes [provider]   olmesartan (BENICAR) 40 MG tablet Take 1 tablet by mouth daily 11/24/23  Yes [provider]   LORazepam (ATIVAN) 0.5 MG tablet Take 1 tablet by mouth nightly. 11/24/23  Yes [provider]   HYDROcodone-acetaminophen (NORCO) 5-325 MG per tablet Take 1 tablet by mouth every 6 hours as needed for Pain. 11/23/23  Yes [provider]   fluticasone Aleda Grana) 50 MCG/ACT nasal spray 2 sprays by Nasal route daily as needed 05/24/23  Yes [provider]   flecainide (TAMBOCOR) 50 MG tablet Take 1 tablet by mouth 2 times daily 11/24/23 08/20/24 Yes [provider]   fexofenadine (ALLEGRA ALLERGY) 180 MG tablet Take 1 tablet by mouth daily   Yes [provider]   esomeprazole (NEXIUM) 40 MG delayed release capsule Take 1 capsule by mouth daily 11/24/23  Yes [provider]   DULoxetine (CYMBALTA) 20 MG extended release capsule Take 1 capsule by mouth 2 times daily 01/01/23  Yes  [provider]   donepezil (ARICEPT) 5 MG tablet Take 1 tablet by mouth every morning 11/24/23  Yes [provider]   vitamin D3 (CHOLECALCIFEROL) 125 MCG (5000 UT) TABS tablet Take 1 tablet by mouth daily   Yes [provider]   Cetirizine HCl (ZYRTEC ALLERGY) 10 MG CAPS Take 10 mg by mouth daily   Yes [provider]   celecoxib (CELEBREX) 200 MG capsule Take 1 capsule by mouth daily 11/24/23  Yes [provider]   baclofen (LIORESAL) 10 MG tablet Take 1 tablet by mouth as needed   Yes [provider]   amLODIPine (NORVASC) 5 MG tablet Take 1 tablet by mouth daily 08/11/23  Yes [provider]       Current medications:    Current Facility-Administered Medications   Medication Dose Route Frequency Provider Last Rate Last Admin   . [START ON 12/03/2023] lidocaine PF 1 % injection 1 mL  1 mL IntraDERmal Once PRN Roxan Hockey, Dominic W, DO       . 0.9 % sodium chloride infusion   IntraVENous Continuous Roxan Hockey, Dominic W, DO       .  lactated ringers infusion   IntraVENous Continuous Erma Pinto, DO 125 mL/hr at 12/02/23 1409 New Bag at 12/02/23 1409   . sodium chloride flush 0.9 % injection 5-40 mL  5-40 mL IntraVENous 2 times per day Roxan Hockey, Dominic W, DO       . sodium chloride flush 0.9 % injection 5-40 mL  5-40 mL IntraVENous PRN Roxan Hockey, Dominic W, DO       . 0.9 % sodium chloride infusion   IntraVENous PRN Roxan Hockey Dominic W, DO           Allergies:    Allergies   Allergen Reactions   . Bacitracin Hives   . Iodinated Contrast Media Other (See Comments)     Facial flushing, increased respirations, minor lip swelling.   . Nsaids Other (See Comments)     Contraindicated due to history of GI bleed.       Problem List:  There is no problem list on file for this patient.      Past Medical History:        Diagnosis Date   . Arrhythmia     multiple PVCs   . Arthritis     spine, knees, hands   . Back pain     spinal stimulator in place [place] brand    . Basal cell carcinoma     multiple sites   . Cervical vertebral fusion     natural fusion with neck brace R/T arthritis   . Colon polyp    . Eczema of both hands    . History of blood transfusion 2017    with GI bleed   . Hypertension    . Sleep apnea        Past Surgical History:        Procedure Laterality Date   . APPENDECTOMY      in his teens   . BLEPHAROPLASTY     . COLONOSCOPY      multiple and sigmoidoscopy as well   . LUMBAR DISC SURGERY      x 2   . SKIN BIOPSY      basal cell   . SPINAL CORD STIMULATOR IMPLANT Right 11/18/2023   . TONSILLECTOMY      age 53       Social History:    Social History     Tobacco Use   . Smoking status: Never   . Smokeless tobacco: Never   Substance Use Topics   . Alcohol use: Yes     Comment: rare                                Counseling given: Not Answered      Vital Signs (Current):   Vitals:    12/02/23 1314   BP: 138/83   Pulse: 63   Resp: 18   Temp: 97.3 F (36.3 C)   TempSrc: Temporal   SpO2: 97%   Weight: 96.2 kg (212 lb)   Height: 1.905 m (6\' 3" )                                              BP Readings from Last 3 Encounters:   12/02/23 138/83       NPO Status: Time of last liquid consumption: 1000  Time of last solid consumption: 2300                        Date of last liquid consumption: 12/02/23                        Date of last solid food consumption: 11/30/23    BMI:   Wt Readings from Last 3 Encounters:   12/02/23 96.2 kg (212 lb)     Body mass index is 26.5 kg/m.    CBC: No results found for: "WBC", "RBC", "HGB", "HCT", "MCV", "RDW", "PLT"    CMP: No results found for: "NA", "K", "CL", "CO2", "BUN", "CREATININE", "GFRAA", "AGRATIO", "LABGLOM", "GLUCOSE", "GLU", "CALCIUM", "BILITOT", "ALKPHOS", "AST", "ALT"    POC Tests: No results for input(s): "POCGLU", "POCNA", "POCK", "POCCL", "POCBUN", "POCHEMO", "POCHCT" in the last 72 hours.    Coags: No results found for: "PROTIME", "INR", "APTT"    HCG (If Applicable): No results found for:  "PREGTESTUR", "PREGSERUM", "HCG", "HCGQUANT"     ABGs: No results found for: "PHART", "PO2ART", "PCO2ART", "HCO3ART", "BEART", "O2SATART"     Type & Screen (If Applicable):  No results found for: "ABORH", "LABANTI"    Drug/Infectious Status (If Applicable):  No results found for: "HIV", "HEPCAB"    COVID-19 Screening (If Applicable): No results found for: "COVID19"        Anesthesia Evaluation    Airway: Mallampati: I  TM distance: >3 FB   Neck ROM: full  Mouth opening: > = 3 FB   Dental:          Pulmonary:   (+)     sleep apnea:           (-) shortness of breath                           Cardiovascular:  Exercise tolerance: good (>4 METS)  (+) hypertension:, pacemaker: pacemaker, dysrhythmias: PVC    (-)  angina                Neuro/Psych:               GI/Hepatic/Renal:             Endo/Other:                     Abdominal:             Vascular:          Other Findings:       Anesthesia Plan      MAC     ASA 3                               Nunzio Cobbs, MD   12/02/2023

## 2023-12-02 NOTE — H&P (Signed)
 History and Physical Service   [facility]    HISTORY AND PHYSICAL EXAMINATION            Date of Evaluation: 12/02/2023  Patient name:  Mason Blackwell  MRN:   [id number]  Date of Birth:  08-Dec-1950  PCP:    Annette Stable, MD    History Obtained From:     Patient, medical records    History of Present Illness:     This is Mason Blackwell a 73 y.o. male who presents today for a COLONOSCOPY DIAGNOSTIC by August Saucer, MD for Hx of colonic polyps; Rectal bleeding. Patient denies bowel changes. He does have a history of constipation with rectal bleeding. Denies bloody tarry stools, diarrhea, nausea, vomiting, abdominal pain or unintentional weight loss. Patient followed bowel prep until watery clear. Has had previous colonoscopy and has been found to have polyps in the past. No FH colon cancer. Denies fever, chills, shortness of breath, cough, congestion, wheezing, chest pain, open sores or wounds. Denies hx of diabetes. Denies any current blood thinning medications.     Past Medical History:     Past Medical History:   Diagnosis Date    Arrhythmia     multiple PVCs    Arthritis     spine, knees, hands    Back pain     spinal stimulator in place [employer] [employer]    Basal cell carcinoma     multiple sites    Cervical vertebral fusion     natural fusion with neck brace R/T arthritis    Colon polyp     Eczema of both hands     History of blood transfusion 2017    with GI bleed    Hypertension     Sleep apnea         Past Surgical History:     Past Surgical History:   Procedure Laterality Date    APPENDECTOMY      in his teens    BLEPHAROPLASTY      COLONOSCOPY      multiple and sigmoidoscopy as well    LUMBAR DISC SURGERY      x 2    SKIN BIOPSY      basal cell    SPINAL CORD STIMULATOR IMPLANT Right 11/18/2023    TONSILLECTOMY      age 72        Medications Prior to Admission:     Prior to Admission medications    Medication Sig Start Date End Date Taking? Authorizing Provider   B Complex-Folic Acid  (B COMPLEX-VITAMIN B12 PO) Take 1 tablet by mouth daily   Yes [provider]   metoprolol succinate (TOPROL XL) 50 MG extended release tablet Take 1 tablet by mouth daily 11/24/23  Yes [provider]   triamcinolone (KENALOG) 0.1 % cream Apply 0.1 g topically As needed for eczema on hands.   Yes [provider]   traMADol (ULTRAM) 50 MG tablet Take 1 tablet by mouth daily as needed.   Yes [provider]   simvastatin (ZOCOR) 20 MG tablet Take 1 tablet by mouth three times a week 11/24/23  Yes [provider]   olmesartan (BENICAR) 40 MG tablet Take 1 tablet by mouth daily 11/24/23  Yes [provider]   LORazepam (ATIVAN) 0.5 MG tablet Take 1 tablet by mouth nightly. 11/24/23  Yes [provider]   HYDROcodone-acetaminophen (NORCO) 5-325 MG per tablet Take  1 tablet by mouth every 6 hours as needed for Pain. 11/23/23  Yes [provider]   fluticasone Aleda Grana) 50 MCG/ACT nasal spray 2 sprays by Nasal route daily as needed 05/24/23  Yes [provider]   flecainide (TAMBOCOR) 50 MG tablet Take 1 tablet by mouth 2 times daily 11/24/23 08/20/24 Yes [provider]   fexofenadine (ALLEGRA ALLERGY) 180 MG tablet Take 1 tablet by mouth daily   Yes [provider]   esomeprazole (NEXIUM) 40 MG delayed release capsule Take 1 capsule by mouth daily 11/24/23  Yes [provider]   DULoxetine (CYMBALTA) 20 MG extended release capsule Take 1 capsule by mouth 2 times daily 01/01/23  Yes [provider]   donepezil (ARICEPT) 5 MG tablet Take 1 tablet by mouth every morning 11/24/23  Yes [provider]   vitamin D3 (CHOLECALCIFEROL) 125 MCG (5000 UT) TABS tablet Take 1 tablet by mouth daily   Yes [provider]   Cetirizine HCl (ZYRTEC ALLERGY) 10 MG CAPS Take 10 mg by mouth daily   Yes [provider]   celecoxib (CELEBREX) 200 MG capsule Take 1 capsule by mouth daily 11/24/23  Yes [provider]   baclofen (LIORESAL) 10 MG tablet Take 1 tablet by mouth as needed   Yes [provider]   amLODIPine (NORVASC) 5 MG tablet Take 1 tablet by mouth daily 08/11/23  Yes [provider]        Allergies:     Bacitracin, Iodinated contrast media, and Nsaids    Social History:     Tobacco:    reports that he has never smoked. He has never used smokeless tobacco.  Alcohol:      reports current alcohol use.  Drug Use:  reports no history of drug use.    Family History:     Family History   Problem Relation Age of Onset    COPD Mother         smoker    Heart Disease Father     Cancer Father         smoker, small cell       Review of Systems:     Positive and Negative as described in HPI.    CONSTITUTIONAL: negative for fevers, chills, sweats, fatigue, weight loss  HEENT: Glasses. negative for hearing changes, runny nose, throat pain  RESPIRATORY: Sleep apnea - does not wear CPAP. negative for shortness of breath, cough, congestion, wheezing.  CARDIOVASCULAR: negative for chest pain, palpitations.  GASTROINTESTINAL: See HPI  GENITOURINARY: negative for difficulty of urination, burning with urination, frequency   INTEGUMENT: Surgical incision lower back s/p spinal stimulator 11/18/23 - covered with dry dressing. negative for rash, skin lesions, easy bruising   HEMATOLOGIC/LYMPHATIC: negative for swelling/edema   ALLERGIC/IMMUNOLOGIC: negative for urticaria , itching  ENDOCRINE: negative increase in drinking, increase in urination, hot or cold intolerance  MUSCULOSKELETAL: Arthralgias - neck, back, bilateral knees and hands. negative muscle aches, swelling of joints  NEUROLOGICAL: Spinal cord stimulator - turned off. negative for headaches, dizziness, lightheadedness, numbness, pain, tingling extremities  BEHAVIOR/PSYCH: negative for depression, anxiety    Physical Exam:   BP 138/83   Pulse 63   Temp 97.3 F (36.3 C) (Temporal)   Resp 18   Ht 1.905 m (6\' 3" )   Wt 96.2 kg (212 lb)   SpO2  97%   BMI 26.50 kg/m   No LMP for male patient.  No obstetric history on file.  No results for input(s): "POCGLU" in the last 72 hours.    General Appearance: alert, well appearing, and in no acute distress  Mental status: oriented to person, place, and time with normal affect  Head: normocephalic, atraumatic.  Eye: Wearing glasses. no icterus, redness, pupils equal and reactive, extraocular eye movements intact, conjunctiva clear  Ear: normal external ear, no discharge, hearing intact  Nose: no drainage noted  Mouth: mucous membranes moist  Neck: supple, no carotid bruits, thyroid not palpable  Lungs: Bilateral equal air entry, clear to ausculation, no wheezing, rales or rhonchi, normal effort  Cardiovascular: HR 63 normal rate, regular rhythm, no murmur, gallop, rub.  Abdomen: Soft, nontender, nondistended, normal bowel sounds  Neurologic: There are no new focal motor or sensory deficits, normal muscle tone and bulk, no abnormal sensation, normal speech, cranial nerves II through XII grossly intact  Skin: No gross visible lesions, rashes, bruising or bleeding on exposed skin area  Extremities: peripheral pulses palpable, no pedal edema or calf pain with palpation  Psych: normal affect     Investigations:      Laboratory Testing:  No results found for this or any previous visit (from the past 24 hour(s)).    No results for input(s): "HGB", "HCT", "WBC", "MCV", "PLATELET", "NA", "K", "CL", "CO2", "BUN", "CREATININE", "GLUCOSE", "INR", "PROTIME", "APTT", "AST", "ALT", "LABALBU", "HCG" in the last 720 hours.    No results for input(s): "COVID19" in the last 720 hours.  Imaging/Diagnostics:    No results found.    Diagnosis:      Hx of colonic polyps; Rectal bleeding    Plans:     1. COLONOSCOPY DIAGNOSTIC      Pat Kocher, APRN - CNP  12/02/2023  2:22 PM

## 2023-12-02 NOTE — Op Note (Signed)
 Operative Note      PROCEDURE NOTE    DATE OF PROCEDURE: 12/02/2023    SURGEON: August Saucer, MD  Facility : [facility].  ASSISTANT: None  Anesthesia: MAC  PREOPERATIVE DIAGNOSIS: Rectal bleeding and personal history of colon    POSTOPERATIVE DIAGNOSIS: as described below    OPERATION: Total colonoscopy     ANESTHESIA: MAC    ESTIMATED BLOOD LOSS: less than 50     COMPLICATIONS: None.     SPECIMENS:  Was Not Obtained    HISTORY: The patient is a 73 y.o. year old male with history of above preop diagnosis.  I recommended colonoscopy with possible biopsy or polypectomy and I explained the risk, benefits, expected outcome, and alternatives to the procedure.  Risks included but are not limited to bleeding, infection, respiratory distress, hypotension, and perforation of the colon and possibility of missing a lesion.  The patient understands and is in agreement.      PROCEDURE: The patient was given MAC.  The patient's SPO2 remained above 90% throughout the procedure.     The colonoscope was inserted per rectum and advanced under direct vision to the cecum without difficulty.      Post sedation note :The patient's SPO2 remained above 90% throughout the procedure.the vital signs remained stable , and no immediate complication form the procedure noted, patient will be ready for d/c when criteria is met .        The prep was excellent.      Findings:  Terminal ileum: not examined    Cecum/Ascending colon: normal    Transverse colon: normal    Descending/Sigmoid colon: Numerous diverticula were seen    Rectum/Anus: examined in normal and retroflexed positions and was small grade 1 internal hemorrhoids are    Withdrawal Time was (minutes): 9    The colon was decompressed and the scope was removed.  The patient tolerated the procedure well.     Recommendations/Plan:   Lifestyle and dietary modifications as discussed  F/U In Office as needed.  Discussed with the family  Repeat colonoscopy in 10 years    Electronically  signed by August Saucer, MD  on 12/02/2023 at 3:13 PM

## 2023-12-02 NOTE — Anesthesia Post-Procedure Evaluation (Signed)
Department of Anesthesiology  Postprocedure Note    Patient: NEHAN FLAUM  MRN: [id number]  Birthdate: 12-01-1950  Date of evaluation: 12/02/2023    Procedure Summary       Date: 12/02/23 Room / Location: [facility] / [facility]    Anesthesia Start: 1447 Anesthesia Stop: 1521    Procedure: COLONOSCOPY DIAGNOSTIC Diagnosis:       Hx of colonic polyps      Rectal bleeding      (Hx of colonic polyps [Z86.0100])      (Rectal bleeding [K62.5])    Surgeons: August Saucer, MD Responsible Provider: Nunzio Cobbs, MD    Anesthesia Type: MAC ASA Status: 3            Anesthesia Type: No value filed.    Aldrete Phase I: Aldrete Score: 10    Aldrete Phase II: Aldrete Score: 10    Anesthesia Post Evaluation    Patient location during evaluation: PACU  Patient participation: complete - patient participated  Level of consciousness: awake  Airway patency: patent  Nausea & Vomiting: no nausea  Cardiovascular status: blood pressure returned to baseline  Respiratory status: acceptable  Hydration status: euvolemic  Comments: Multimodal analgesia pain management as indicated by procedure  Multimodal analgesia pain management approach  Pain management: adequate    No notable events documented.

## 2023-12-02 NOTE — Discharge Instructions (Signed)
 Colonoscopy: What to Expect at Home  Your Recovery  Your doctor will talk to you about when you will need your next colonoscopy. The results of your test and your risk for colorectal cancer will help your doctor decide how often you need to be checked.  After the test, you may be bloated or have gas pains. You may need to pass gas. If a biopsy was done or a polyp was removed, you may have streaks of blood in your stool (feces) for a few days.    How can you care for yourself at home?  Activity  Rest as much as you need to after you go home.  You should be able to go back to your usual activities the day after the test.  Diet  Follow your doctor's directions for eating.  Drink plenty of fluids (unless your doctor has told you not to) to replace the fluids that were lost during the colon prep.  Medicines  If polyps were removed or a biopsy was done during the test, your doctor may tell you not to take aspirin or other anti-inflammatory medicines, such as ibuprofen (Advil, Motrin) and naproxen (Aleve), for a few days.  Follow-up care is a key part of your treatment and safety. Be sure to make and go to all appointments, and call your doctor if you are having problems.  When should you call for help?  Call 911 anytime you think you may need emergency care. For example, call if:  You passed out (lost consciousness).  You pass maroon or bloody stools.  You have severe belly pain.  Call your doctor now or seek immediate medical care if:  Your stools are black and tarlike.  Your stools have streaks of blood, but you did not have a biopsy or any polyps removed.  You have belly pain, or your belly is swollen and firm.  You vomit.  You have a fever.  You are very dizzy.  Watch closely for changes in your health, and be sure to contact your doctor if you have any problems.   Where can you learn more?   Go to [email or web address] and sign in to your MyChart account. Enter E264 in the Search Health  Information box to learn more about "Colonoscopy: What to Expect at Home."        2006-2013 [employer]. Care instructions adapted under license by [facility]. This care instruction is for use with your licensed healthcare professional. If you have questions about a medical condition or this instruction, always ask your healthcare professional. [employer] disclaims any warranty or liability for your use of this information.  Content Version: 9.9.209917; Last Revised: December 16, 2011
